# Patient Record
Sex: Male | Born: 1995 | Race: White | Hispanic: No | Marital: Single
Health system: Southern US, Community
[De-identification: ages and names within clinical notes are randomized; demographics above are authoritative.]

## PROBLEM LIST (undated history)

## (undated) DIAGNOSIS — K5100 Ulcerative (chronic) pancolitis without complications: Secondary | ICD-10-CM

## (undated) HISTORY — DX: Ulcerative (chronic) pancolitis without complications: K51.00

---
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
## 2015-03-11 ENCOUNTER — Ambulatory Visit: Payer: Self-pay | Admitting: Medical
